# Patient Record
Sex: Male | Born: 1975 | Race: White | Hispanic: Yes | Marital: Single | Smoking: Never smoker
Health system: Southern US, Community
[De-identification: ages and names within clinical notes are randomized; demographics above are authoritative.]

---

## 2015-07-19 ENCOUNTER — Emergency Department (HOSPITAL_COMMUNITY): Payer: Self-pay

## 2015-07-19 ENCOUNTER — Encounter (HOSPITAL_COMMUNITY): Payer: Self-pay | Admitting: Emergency Medicine

## 2015-07-19 DIAGNOSIS — R1013 Epigastric pain: Secondary | ICD-10-CM | POA: Insufficient documentation

## 2015-07-19 DIAGNOSIS — Z79899 Other long term (current) drug therapy: Secondary | ICD-10-CM | POA: Insufficient documentation

## 2015-07-19 DIAGNOSIS — I10 Essential (primary) hypertension: Secondary | ICD-10-CM | POA: Insufficient documentation

## 2015-07-19 DIAGNOSIS — M549 Dorsalgia, unspecified: Secondary | ICD-10-CM | POA: Insufficient documentation

## 2015-07-19 LAB — CBC
HCT: 46.3 % (ref 39.0–52.0)
HEMOGLOBIN: 15.4 g/dL (ref 13.0–17.0)
MCH: 27.9 pg (ref 26.0–34.0)
MCHC: 33.3 g/dL (ref 30.0–36.0)
MCV: 83.9 fL (ref 78.0–100.0)
PLATELETS: 223 10*3/uL (ref 150–400)
RBC: 5.52 MIL/uL (ref 4.22–5.81)
RDW: 13.2 % (ref 11.5–15.5)
WBC: 14.6 10*3/uL — ABNORMAL HIGH (ref 4.0–10.5)

## 2015-07-19 LAB — BASIC METABOLIC PANEL
ANION GAP: 8 (ref 5–15)
BUN: 17 mg/dL (ref 6–20)
CALCIUM: 9.2 mg/dL (ref 8.9–10.3)
CO2: 28 mmol/L (ref 22–32)
CREATININE: 0.8 mg/dL (ref 0.61–1.24)
Chloride: 100 mmol/L — ABNORMAL LOW (ref 101–111)
GLUCOSE: 120 mg/dL — AB (ref 65–99)
Potassium: 4 mmol/L (ref 3.5–5.1)
Sodium: 136 mmol/L (ref 135–145)

## 2015-07-19 LAB — I-STAT TROPONIN, ED: TROPONIN I, POC: 0 ng/mL (ref 0.00–0.08)

## 2015-07-19 NOTE — ED Notes (Signed)
Patient arrives with complaint of chest pain secondary to eating. States onset about 1 hour ago. States history of similar and was seen by PCP after previous incidence but he was not diagnosed. Also endorses dizziness, lightheadedness, nausea, and left arm pain.

## 2015-07-20 ENCOUNTER — Emergency Department (HOSPITAL_COMMUNITY)
Admission: EM | Admit: 2015-07-20 | Discharge: 2015-07-20 | Disposition: A | Discharge status: Home / Self Care | Payer: Self-pay | Attending: Emergency Medicine | Admitting: Emergency Medicine

## 2015-07-20 DIAGNOSIS — R1013 Epigastric pain: Secondary | ICD-10-CM

## 2015-07-20 LAB — HEPATIC FUNCTION PANEL
ALBUMIN: 4.3 g/dL (ref 3.5–5.0)
ALK PHOS: 87 U/L (ref 38–126)
ALT: 30 U/L (ref 17–63)
AST: 28 U/L (ref 15–41)
BILIRUBIN TOTAL: 0.8 mg/dL (ref 0.3–1.2)
Total Protein: 7.5 g/dL (ref 6.5–8.1)

## 2015-07-20 LAB — LIPASE, BLOOD: Lipase: 32 U/L (ref 11–51)

## 2015-07-20 MED ORDER — SUCRALFATE 1 G PO TABS: 1.0000 g | ORAL_TABLET | Freq: Three times a day (TID) | ORAL | Status: DC

## 2015-07-20 MED ORDER — OMEPRAZOLE 20 MG PO CPDR: 20.0000 mg | DELAYED_RELEASE_CAPSULE | Freq: Two times a day (BID) | ORAL | Status: DC

## 2015-07-20 NOTE — ED Notes (Signed)
Patient reports that he has episodes of epigastric pain that radiates to his back - occurring after eating x 1 month. Reports he was prescribed Omeprazole, but states it was not effective.

## 2015-07-20 NOTE — Discharge Instructions (Signed)
YOU WILL NEED TO FOLLOW UP WITH A SPECIALIST FOR FURTHER EVALUATION OF RECURRENT PAIN WITH EATING. CALL [facility] FOR AN APPOINTMENT. TAKE MEDICATIONS AS PRESCRIBED. YOU ARE BEING PUT BACK ON PRILOSEC BUT AT AN INCREASED DOSE. RETURN TO THE EMERGENCY DEPARTMENT IF YOU HAVE ANY SEVERE PAIN, HIGH FEVER, OR NEW CONCERN.   Dispepsia (Indigestion) La dispepsia es una sensacin de dolor, Dentistmalestar, ardor o saciedad en la parte superior del abdomen. Puede aparecer y Geneticist, moleculardesaparecer, y ocurrir con frecuencia o en contadas ocasiones. La dispepsia suele producirse mientras una persona est comiendo o inmediatamente despus de haber terminado de comer. Puede ser ms intensa durante la noche y al inclinarse o al Arsenio Loaderacostarse. INSTRUCCIONES PARA EL CUIDADO EN EL HOGAR Tome estas medidas para Engineer, materialsaliviar el dolor o Environmental health practitionerel malestar y para Automotive engineerevitar las complicaciones. Dieta  Siga la dieta que le haya recomendado el mdico, la cual puede incluir evitar alimentos y bebidas tales como:  Caf y t (con o sin cafena).  Bebidas que contengan alcohol.  Bebidas energizantes y deportivas.  Gaseosas o refrescos.  Chocolate y cacao.  Menta y esencias de [address].  Ajo y cebollas.  Rbano picante.  Alimentos muy condimentados y cidos, entre ellos, pimientos, [place] en polvo, curry en polvo, vinagre, salsas picantes y [address] barbacoa.  Frutas ctricas y sus jugos, como naranjas, limones y limas.  Alimentos a base de tomates, como salsa roja, [place], salsa y pizza con salsa roja.  Alimentos fritos y Lexicographergrasos, como rosquillas, papas fritas y aderezos con alto contenido de Holiday representativegrasa.  Carnes con alto contenido de Big Copp[place] Keygrasa, como hot dogs y cortes grasos de carnes rojas y blancas, por ejemplo, filetes de entrecot, salchicha, jamn y tocino.  Productos lcteos con alto contenido de Brasheargrasa, como Mountvilleleche entera, Bon Airmantequilla y queso crema.  Haga comidas pequeas y frecuentes Freight forwarderdurante el da en lugar de comidas abundantes.  Evite  beber mucho lquido con las comidas.  No coma durante las 2 o 3horas previas a la hora de Benndaleacostarse.  No se acueste inmediatamente despus de comer.  No haga actividad fsica enseguida despus de comer. Instrucciones generales  Est atento a cualquier cambio en los sntomas.  Tome los medicamentos de venta libre y los recetados solamente como se lo haya indicado el mdico. No tome aspirina, ibuprofeno ni otros antiinflamatorios no esteroides (AINE), a menos que se lo haya indicado el mdico.  No consuma ningn producto que contenga tabaco, lo que incluye cigarrillos, tabaco de Theatre managermascar y Administrator, Civil Servicecigarrillos electrnicos. Si necesita ayuda para dejar de fumar, consulte al mdico.  Use ropas sueltas. No use prendas ajustadas alrededor de la cintura que ejerzan presin en el abdomen.  Levante (eleve) unas 6pulgadas (15centmetros) la cabecera de la cama.  Trate de reducir J. C. Penneyel nivel de estrs con actividades como el yoga o la meditacin. Si necesita ayuda para reducir J. C. Penneyel nivel de estrs, consulte al mdico.  Si tiene sobrepeso, Media planneradelgace hasta alcanzar un peso saludable. Hable con el mdico acerca de su peso ideal y pdale asesoramiento en cuanto a la dieta que debe seguir para Aeronautical engineerpoder alcanzarlo.  Concurra a todas las visitas de control como se lo haya indicado el mdico. Esto es importante. SOLICITE ATENCIN MDICA SI:  Aparecen nuevos sntomas.  Baja de peso sin causa aparente.  Tiene dificultad para tragar o siente dolor al Darden Restaurantshacerlo.  Los sntomas no mejoran con Scientist, research (medical)el tratamiento.  Los sntomas duran ms de [address] das.  Tiene fiebre.  Vomita. SOLICITE ATENCIN MDICA DE INMEDIATO SI:  Tiene dolor en  los brazos, el cuello, los Howard, la dentadura o la espalda.  Berenice Primas, se marea o tiene sensacin de desvanecimiento.  Se desmaya.  Siente falta de aire o Journalist, newspaper.  No puede dejar de vomitar o vomita sangre.  Las heces son sanguinolentas o de color negro.  Siente un  dolor intenso en el abdomen.   Esta informacin no tiene Theme park manager el consejo del mdico. Asegrese de hacerle al mdico cualquier pregunta que tenga.   Document Released: 02/13/2005 Document Revised: 11/04/2014 Elsevier Interactive Patient Education [employer]

## 2015-07-20 NOTE — ED Provider Notes (Signed)
CSN: [id number]     Arrival date & time 07/19/15  2209 History   First MD Initiated Contact with Patient 07/20/15 [phone]     Chief Complaint  Patient presents with  . Chest Pain     (Consider location/radiation/quality/duration/timing/severity/associated sxs/prior Treatment) HPI Comments: Patient with history of HTN presents with complaint of sharp pain in the epigastrium and chest that started around 15 minutes to one hour after eating. He can eat any type of food with the same result. The pain subsides with belching. No SOB, cough, fever or vomiting. No change in bowel movements. He had been given prilosec QD for symptoms but stopped taking it several days ago because it did not help with his symptoms.   Patient is a 40 y.o. male presenting with chest pain. The history is provided by the patient. No language interpreter was used.  Chest Pain Associated symptoms: abdominal pain and back pain (Pain radiates to back.)   Associated symptoms: no fever, no nausea, no shortness of breath and not vomiting     History reviewed. No pertinent past medical history. History reviewed. No pertinent past surgical history. History reviewed. No pertinent family history. Social History  Substance Use Topics  . Smoking status: Never Smoker   . Smokeless tobacco: None  . Alcohol Use: No    Review of Systems  Constitutional: Negative for fever and chills.  Respiratory: Negative.  Negative for shortness of breath.   Cardiovascular: Positive for chest pain.  Gastrointestinal: Positive for abdominal pain. Negative for nausea and vomiting.  Genitourinary: Negative.   Musculoskeletal: Positive for back pain (Pain radiates to back.).  Skin: Negative.   Neurological: Negative.       Allergies  Aspirin  Home Medications   Prior to Admission medications   Medication Sig Start Date End Date Taking? Authorizing Provider  lisinopril (PRINIVIL,ZESTRIL) 20 MG tablet Take 20 mg by mouth daily.   Yes  Historical Provider, MD  omeprazole (PRILOSEC) 20 MG capsule Take 20 mg by mouth daily.   Yes Historical Provider, MD   BP 135/87 mmHg  Pulse 66  Temp(Src) 98 F (36.7 C) (Oral)  Resp 21  SpO2 97% Physical Exam  Constitutional: He is oriented to person, place, and time. He appears well-developed and well-nourished.  HENT:  Head: Normocephalic.  Neck: Normal range of motion. Neck supple.  Cardiovascular: Normal rate and regular rhythm.   Pulmonary/Chest: Effort normal and breath sounds normal. He has no wheezes. He has no rales.  Abdominal: Soft. Bowel sounds are normal. There is no tenderness. There is no rebound and no guarding.  Musculoskeletal: Normal range of motion.  Neurological: He is alert and oriented to person, place, and time.  Skin: Skin is warm and dry. No rash noted.  Psychiatric: He has a normal mood and affect.    ED Course  Procedures (including critical care time) Labs Review Labs Reviewed  BASIC METABOLIC PANEL - Abnormal; Notable for the following:    Chloride 100 (*)    Glucose, Bld 120 (*)    All other components within normal limits  CBC - Abnormal; Notable for the following:    WBC 14.6 (*)    All other components within normal limits  HEPATIC FUNCTION PANEL - Abnormal; Notable for the following:    Bilirubin, Direct <0.1 (*)    All other components within normal limits  LIPASE, BLOOD  I-STAT TROPOININ, ED    Imaging Review Dg Chest 2 View  07/19/2015  CLINICAL DATA:  Chronic  generalized chest and abdominal pain and nausea. Initial encounter. EXAM: CHEST  2 VIEW COMPARISON:  None. FINDINGS: The lungs are well-aerated. Mild peribronchial thickening is noted. There is no evidence of focal opacification, pleural effusion or pneumothorax. The heart is normal in size; the mediastinal contour is within normal limits. No acute osseous abnormalities are seen. IMPRESSION: Mild peribronchial thickening noted.  Lungs otherwise grossly clear. Electronically  Signed   By: Roanna RaiderJeffery  Chang M.D.   On: 07/19/2015 23:02   I have personally reviewed and evaluated these images and lab results as part of my medical decision-making.   EKG Interpretation None      MDM   Final diagnoses:  None    1. Dyspepsia  The patient presents with post-prandial pain, improved with belching. DDx: gall stones vs indigestion vs ulcers. He has negative cardiac work up (non-ischemic EKG, negative troponin) doubt CAD. Normal LFT's, lipase - doubt acute cholecystitis, cannot rule out gall stones but as he is pain free now and non-tender, US not felt urgent. He is afebrile. He is felt stable for discharge home. Will refer to GI for further evaluation and management.     Elpidio AnisShari Ger Nicks, PA-C 07/22/15 0840  Derwood KaplanAnkit Nanavati, MD 07/22/15 2303

## 2016-05-11 ENCOUNTER — Encounter: Payer: Self-pay | Admitting: Oncology

## 2016-05-17 ENCOUNTER — Other Ambulatory Visit (HOSPITAL_COMMUNITY)
Admission: RE | Admit: 2016-05-17 | Discharge: 2016-05-17 | Disposition: A | Discharge status: Home / Self Care | Payer: Self-pay | Source: Ambulatory Visit | Attending: Oncology | Admitting: Oncology

## 2016-05-17 ENCOUNTER — Ambulatory Visit (HOSPITAL_BASED_OUTPATIENT_CLINIC_OR_DEPARTMENT_OTHER): Payer: Self-pay

## 2016-05-17 ENCOUNTER — Ambulatory Visit (HOSPITAL_BASED_OUTPATIENT_CLINIC_OR_DEPARTMENT_OTHER): Payer: Self-pay | Admitting: Oncology

## 2016-05-17 ENCOUNTER — Telehealth: Payer: Self-pay | Admitting: Oncology

## 2016-05-17 VITALS — BP 133/79 | HR 78 | Temp 97.9°F | Resp 18 | Ht 68.0 in | Wt 236.6 lb

## 2016-05-17 DIAGNOSIS — D72829 Elevated white blood cell count, unspecified: Secondary | ICD-10-CM

## 2016-05-17 DIAGNOSIS — E119 Type 2 diabetes mellitus without complications: Secondary | ICD-10-CM

## 2016-05-17 DIAGNOSIS — I10 Essential (primary) hypertension: Secondary | ICD-10-CM

## 2016-05-17 LAB — CBC WITH DIFFERENTIAL/PLATELET
BASO%: 0.5 % (ref 0.0–2.0)
Basophils Absolute: 0 10*3/uL (ref 0.0–0.1)
EOS%: 1.1 % (ref 0.0–7.0)
Eosinophils Absolute: 0.1 10*3/uL (ref 0.0–0.5)
HEMATOCRIT: 46.1 % (ref 38.4–49.9)
HEMOGLOBIN: 15.6 g/dL (ref 13.0–17.1)
LYMPH#: 2.5 10*3/uL (ref 0.9–3.3)
LYMPH%: 23.7 % (ref 14.0–49.0)
MCH: 28.6 pg (ref 27.2–33.4)
MCHC: 33.9 g/dL (ref 32.0–36.0)
MCV: 84.4 fL (ref 79.3–98.0)
MONO#: 0.8 10*3/uL (ref 0.1–0.9)
MONO%: 7.9 % (ref 0.0–14.0)
NEUT%: 66.8 % (ref 39.0–75.0)
NEUTROS ABS: 7 10*3/uL — AB (ref 1.5–6.5)
PLATELETS: 214 10*3/uL (ref 140–400)
RBC: 5.46 10*6/uL (ref 4.20–5.82)
RDW: 13.8 % (ref 11.0–14.6)
WBC: 10.5 10*3/uL — AB (ref 4.0–10.3)

## 2016-05-17 LAB — MORPHOLOGY
PLT EST: ADEQUATE
RBC Comments: NORMAL

## 2016-05-17 NOTE — Progress Notes (Signed)
[facility] Cancer Initial Visit:  Patient Care Team: Sandre Kitty, PA-C as PCP - General (Physician Assistant)  CHIEF COMPLAINTS/PURPOSE OF CONSULTATION:  Patient was advised to have  hematology evaluation regarding further workup related to chronic leukocytosis.    HISTORY OF PRESENTING ILLNESS: James Frazier 41 y.o. male is here because of  persistent leukocytosis.  Patient has known history of hypertension diabetes and diabetes mellitus. Patient was also diagnosed with H. pylori infection in 2017. Patient was treated with the broad-spectrum antibiotics. According to the patient, his H. pylori antigen became negative after the end of treatment with antibiotics.  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  In May of 2017 his routine CBC has revealed elevated WBC count around 13,000. However recently on 04/04/16,  his repeat CBC has revealed WBC count of 12.6, hemoglobin 15 g and platelet  count of 241,000. Hence patient was advised to have a hematology evaluation.  At this time patient denies any new symptoms. Patient denies fever, chills or night sweats. Patient denies cough or hemoptysis or  frequent urinary tract infections. Patient denies nausea, vomiting or abdominal pain. Patient denies  bright red r blood per rectum, melena or hematochezia. Patient denies spontaneous bruises or ecchymosis. Patient denies having any lymph gland enlargement.  Patient has no family history of hematological disorders. He was born in [place] has been in the [place] for several years. 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  Patient is accompanied by an interpreter today.     Review of Systems  Constitutional: Negative for appetite change, chills, diaphoresis, fatigue, fever and unexpected weight change.  HENT:   Negative for hearing loss, lump/mass, mouth sores, nosebleeds, sore throat, tinnitus, trouble swallowing  and voice change.   Eyes: Negative for icterus.  Respiratory: Negative for chest tightness, cough, shortness of breath and wheezing.   Cardiovascular: Negative for chest pain, leg swelling and palpitations.  Gastrointestinal: Negative for abdominal distention, abdominal pain, blood in stool, constipation, diarrhea, nausea, rectal pain and vomiting.  Genitourinary: Negative for bladder incontinence, difficulty urinating, dyspareunia, dysuria, frequency, hematuria and nocturia.   Musculoskeletal: Negative for arthralgias, back pain, flank pain, myalgias, neck pain and neck stiffness.  Skin: Negative for itching and rash.  Neurological: Negative for dizziness, headaches, light-headedness, numbness, seizures and speech difficulty.  Hematological: Negative for adenopathy. Does not bruise/bleed easily.  Psychiatric/Behavioral: Negative for confusion, decreased concentration, depression and sleep disturbance. The patient is not nervous/anxious.     MEDICAL HISTORY: History reviewed. No pertinent past medical history.  SURGICAL HISTORY: History reviewed. No pertinent surgical history.  SOCIAL HISTORY: Social History   Social History  . Marital status: Single    Spouse name: N/A  . Number of children: N/A  . Years of education: N/A   Occupational History  . Not on file.   Social History Main Topics  . Smoking status: Never Smoker  . Smokeless tobacco: Not on file  . Alcohol use No  . Drug use: No  . Sexual activity: Not on file   Other Topics Concern  . Not on file   Social History Narrative  . No narrative on file    FAMILY HISTORY History reviewed. No pertinent family history.  ALLERGIES:  is allergic to aspirin.  MEDICATIONS:  Current Outpatient Prescriptions  Medication Sig Dispense Refill  . metFORMIN (GLUCOPHAGE) 500 MG tablet Take 500 mg by mouth 2 (two) times  daily with a meal.    . terbinafine (LAMISIL) 250 MG tablet Take 250 mg by mouth daily.    Marland Kitchen. lisinopril  (PRINIVIL,ZESTRIL) 20 MG tablet Take 20 mg by mouth daily.     No current facility-administered medications for this visit.     PHYSICAL EXAMINATION:  ECOG PERFORMANCE STATUS: 0 - Asymptomatic   Vitals:   05/17/16 0933  BP: 133/79  Pulse: 78  Resp: 18  Temp: 97.9 F (36.6 C)    Filed Weights   05/17/16 0933  Weight: 236 lb 9 oz (107.3 kg)     Physical Exam  Constitutional: He is oriented to person, place, and time and well-developed, well-nourished, and in no distress. No distress.  HENT:  Head: Normocephalic and atraumatic.  Right Ear: External ear normal.  Left Ear: External ear normal.  Nose: Nose normal.  Mouth/Throat: Oropharynx is clear and moist.  Eyes: Conjunctivae and EOM are normal. Pupils are equal, round, and reactive to light. Right eye exhibits no discharge. Left eye exhibits no discharge.  Neck: Normal range of motion. Neck supple. No JVD present. No tracheal deviation present. No thyromegaly present.  Cardiovascular: Normal rate, regular rhythm and intact distal pulses.  Exam reveals no gallop and no friction rub.   No murmur heard. Pulmonary/Chest: Effort normal and breath sounds normal. No stridor. No respiratory distress. He has no wheezes. He has no rales. He exhibits no tenderness.  Abdominal: He exhibits no distension and no mass. There is no tenderness. There is no rebound and no guarding.  Musculoskeletal: Normal range of motion. He exhibits no edema, tenderness or deformity.  Lymphadenopathy:    He has no cervical adenopathy.  Neurological: He is alert and oriented to person, place, and time. No cranial nerve deficit. Gait normal. Coordination normal.  Skin: Skin is warm and dry. He is not diaphoretic. No erythema. No pallor.  Psychiatric: Mood, memory, affect and judgment normal.     LABORATORY DATA: I have personally reviewed the data as listed:  Appointment on 05/17/2016  Component Date Value Ref Range Status  . WBC 05/17/2016 10.5* 4.0  - 10.3 10e3/uL Final  . NEUT# 05/17/2016 7.0* 1.5 - 6.5 10e3/uL Final  . HGB 05/17/2016 15.6  13.0 - 17.1 g/dL Final  . HCT 40/98/119103/21/2018 46.1  38.4 - 49.9 % Final  . Platelets 05/17/2016 214  140 - 400 10e3/uL Final  . MCV 05/17/2016 84.4  79.3 - 98.0 fL Final  . MCH 05/17/2016 28.6  27.2 - 33.4 pg Final  . MCHC 05/17/2016 33.9  32.0 - 36.0 g/dL Final  . RBC 47/82/956203/21/2018 5.46  4.20 - 5.82 10e6/uL Final  . RDW 05/17/2016 13.8  11.0 - 14.6 % Final  . lymph# 05/17/2016 2.5  0.9 - 3.3 10e3/uL Final  . MONO# 05/17/2016 0.8  0.1 - 0.9 10e3/uL Final  . Eosinophils Absolute 05/17/2016 0.1  0.0 - 0.5 10e3/uL Final  . Basophils Absolute 05/17/2016 0.0  0.0 - 0.1 10e3/uL Final  . NEUT% 05/17/2016 66.8  39.0 - 75.0 % Final  . LYMPH% 05/17/2016 23.7  14.0 - 49.0 % Final  . MONO% 05/17/2016 7.9  0.0 - 14.0 % Final  . EOS% 05/17/2016 1.1  0.0 - 7.0 % Final  . BASO% 05/17/2016 0.5  0.0 - 2.0 % Final   No results found.  ASSESSMENT/PLAN  1. Persistent leukocytosis. 2. History of hypertension under control. 3. Diabetes mellitus.  Mr. James Frazier a 41 y.o.Hispanic gentleman has been diagnosed with the mild leukocytosis.  He  has no evidence of any obvious infection. At this time the etiology of leukocytosis is not known. My physical examination is unremarkable without any evidence of hepatosplenomegaly. However I would recommend sending further tests including repeat CBC with  peripheral smear evaluation, flow cytometry and molecular studies to rule out myeloproliferative disorder.  The above tests were sent today in the outpatient clinic. A follow-up appointment has been scheduled in the outpatient clinic in 10 days to discuss the about test results.  I would like to thank again his primary care provider for giving me the opportunity to participate in his care.    Orders Placed This Encounter  Procedures  . CBC with Differential    Standing Status:   Future    Number of Occurrences:   1     Standing Expiration Date:   05/17/2017  . Morphology    Standing Status:   Future    Number of Occurrences:   1    Standing Expiration Date:   05/17/2017  . Flow Cytometry    Leukemia / lymphoma panel    Standing Status:   Future    Number of Occurrences:   1    Standing Expiration Date:   05/17/2017  . BCR-ABL1 FISH    Standing Status:   Future    Number of Occurrences:   1    Standing Expiration Date:   05/17/2017    All questions were answered. The patient knows to call the clinic with any problems, questions or concerns.  This note was electronically signed.    Rudean Haskell, MD  05/17/2016 11:06 AM

## 2016-05-17 NOTE — Telephone Encounter (Signed)
Gave patient avs report and appointments for April  °

## 2016-05-19 LAB — FLOW CYTOMETRY

## 2016-05-31 ENCOUNTER — Ambulatory Visit: Payer: Self-pay

## 2016-06-08 ENCOUNTER — Telehealth: Payer: Self-pay | Admitting: *Deleted

## 2016-06-08 ENCOUNTER — Telehealth: Payer: Self-pay | Admitting: Oncology

## 2016-06-08 NOTE — Telephone Encounter (Signed)
Patient called.  Asked for someone who speaks Spanish to help.  [employer] interpreter operator Michell Heinrich [phone] very helpful.    "Lab work was done two weeks ago.  I need the results."  Shared cbc results with improved total white count.  Provider will go over the rest.  Were you aware of appointment 05-31-2016 at 9:30 am?  "I was told I do not need to come in for an appointment but results would be sent to my PCP.  My PCP nor I have received the lab results yet.  Please ask the doctor.  I need results today."  Called provider.  Patient was instructed to make a F/U appointment when here for the new patient visit.  Verbal order received and read back for patient to reschedule F/U for patient to review results with provider.  Tried to reach scheduler during this call unsuccessfully.  Notified patient he'll need to reschedule appointment to review results with provider.  "It is expensive for me to go there.  If I reschedule will this be a new bill or is it part of the first bill?  Is this necessary?"  Per provider orders and instructions, yes it is necessary to reschedule.  Offered Denied to transfer to scheduler at this time with interpreter help.  "I'll have to think about this."  Confirmed return number, [phone].  Expect a call from a scheduler to reschedule.  Thanks for calling and have a good day.  Unk Pinto and patient say Adios.  Thanked her, she thanked me for using [employer].   Scheduling message sent.

## 2016-06-08 NOTE — Telephone Encounter (Signed)
sw pt using language line interpreter [phone]. Confirmed 4/17 appt at 1030 am per los

## 2016-06-13 ENCOUNTER — Telehealth: Payer: Self-pay | Admitting: Oncology

## 2016-06-13 ENCOUNTER — Ambulatory Visit (HOSPITAL_BASED_OUTPATIENT_CLINIC_OR_DEPARTMENT_OTHER): Payer: Self-pay | Admitting: Oncology

## 2016-06-13 VITALS — BP 137/87 | HR 69 | Temp 98.7°F | Resp 18 | Ht 68.0 in | Wt 236.5 lb

## 2016-06-13 DIAGNOSIS — D72829 Elevated white blood cell count, unspecified: Secondary | ICD-10-CM

## 2016-06-13 NOTE — Progress Notes (Signed)
[facility] Cancer Follow up:    James Kitty, PA-C [address] [place] [address]  INTERVAL HISTORY: James Frazier 41 y.o. male returns for scheduled follow-up visit.  At this time patient denies new complaints I had the opportunity to evaluate him initially for leukocytosis. I recommended sending peripheral blood molecular studies for BCR able translocation. However the above tests came back negative.  Today patient has no new complaints. His repeat CBC has showed his WBC count count has came down. Today I did not perform complete physical examination. However I have reviewed the laboratory results with patient with the help of an interpreter.   There are no active problems to display for this patient.   is allergic to aspirin.  MEDICAL HISTORY: No past medical history on file.  SURGICAL HISTORY: No past surgical history on file.  SOCIAL HISTORY: Social History   Social History  . Marital status: Single    Spouse name: N/A  . Number of children: N/A  . Years of education: N/A   Occupational History  . Not on file.   Social History Main Topics  . Smoking status: Never Smoker  . Smokeless tobacco: Not on file  . Alcohol use No  . Drug use: No  . Sexual activity: Not on file   Other Topics Concern  . Not on file   Social History Narrative  . No narrative on file    FAMILY HISTORY: No family history on file.  Review of Systems - Oncology    PHYSICAL EXAMINATION  ECOG PERFORMANCE STATUS: 0 - Asymptomatic  Vitals:   06/13/16 1116  BP: 137/87  Pulse: 69  Resp: 18  Temp: 98.7 F (37.1 C)    Physical Exam  LABORATORY DATA:  CBC    Component Value Date/Time   WBC 10.5 (H) 05/17/2016 1032   WBC 14.6 (H) 07/19/2015 2228   RBC 5.46 05/17/2016 1032   RBC 5.52 07/19/2015 2228   HGB 15.6 05/17/2016 1032   HCT 46.1 05/17/2016 1032   PLT 214 05/17/2016 1032   MCV 84.4 05/17/2016 1032   MCH 28.6 05/17/2016 1032    MCH 27.9 07/19/2015 2228   MCHC 33.9 05/17/2016 1032   MCHC 33.3 07/19/2015 2228   RDW 13.8 05/17/2016 1032   LYMPHSABS 2.5 05/17/2016 1032   MONOABS 0.8 05/17/2016 1032   EOSABS 0.1 05/17/2016 1032   BASOSABS 0.0 05/17/2016 1032    CMP     Component Value Date/Time   NA 136 07/19/2015 2228   K 4.0 07/19/2015 2228   CL 100 (L) 07/19/2015 2228   CO2 28 07/19/2015 2228   GLUCOSE 120 (H) 07/19/2015 2228   BUN 17 07/19/2015 2228   CREATININE 0.80 07/19/2015 2228   CALCIUM 9.2 07/19/2015 2228   PROT 7.5 07/19/2015 2228   ALBUMIN 4.3 07/19/2015 2228   AST 28 07/19/2015 2228   ALT 30 07/19/2015 2228   ALKPHOS 87 07/19/2015 2228   BILITOT 0.8 07/19/2015 2228   GFRNONAA >60 07/19/2015 2228   GFRAA >60 07/19/2015 2228       PENDING LABS:   RADIOGRAPHIC STUDIES:  No results found.   PATHOLOGY:     ASSESSMENT and THERAPY PLAN:    1. Persistent leukocytosis. 2. History of hypertension under control. 3. Diabetes mellitus.   Patient at this time is asymptomatic. The etiology of her mild leukocytosis is not known. However the molecular studies for myeloproliferative disorders have been negative. His hemoglobin and platelets are  within normal range. Hence I did not make any further recommendations at this time.  I have advised him to be followed by his primary care provider. I have advised him to have a follow-up visit in hematology clinic as needed basis. At this time I have discharged him from the hematology clinic. Patient expressed the understanding and wishes to be followed by his primary care provider.   No orders of the defined types were placed in this encounter.   All questions were answered. The patient knows to call the clinic with any problems, questions or concerns. We can certainly see the patient much sooner if necessary. This note was electronically signed. Rudean Haskell, MD 06/13/2016

## 2016-06-13 NOTE — Telephone Encounter (Signed)
Per Dr. Windell Moment - no los

## 2016-12-03 IMAGING — DX DG CHEST 2V
2 series · 2 of 2 positions shown · non-contrast
Comparison: None.

CLINICAL DATA: Chronic generalized chest and abdominal pain and
nausea. Initial encounter.

EXAM:
CHEST  2 VIEW

[chest pa]
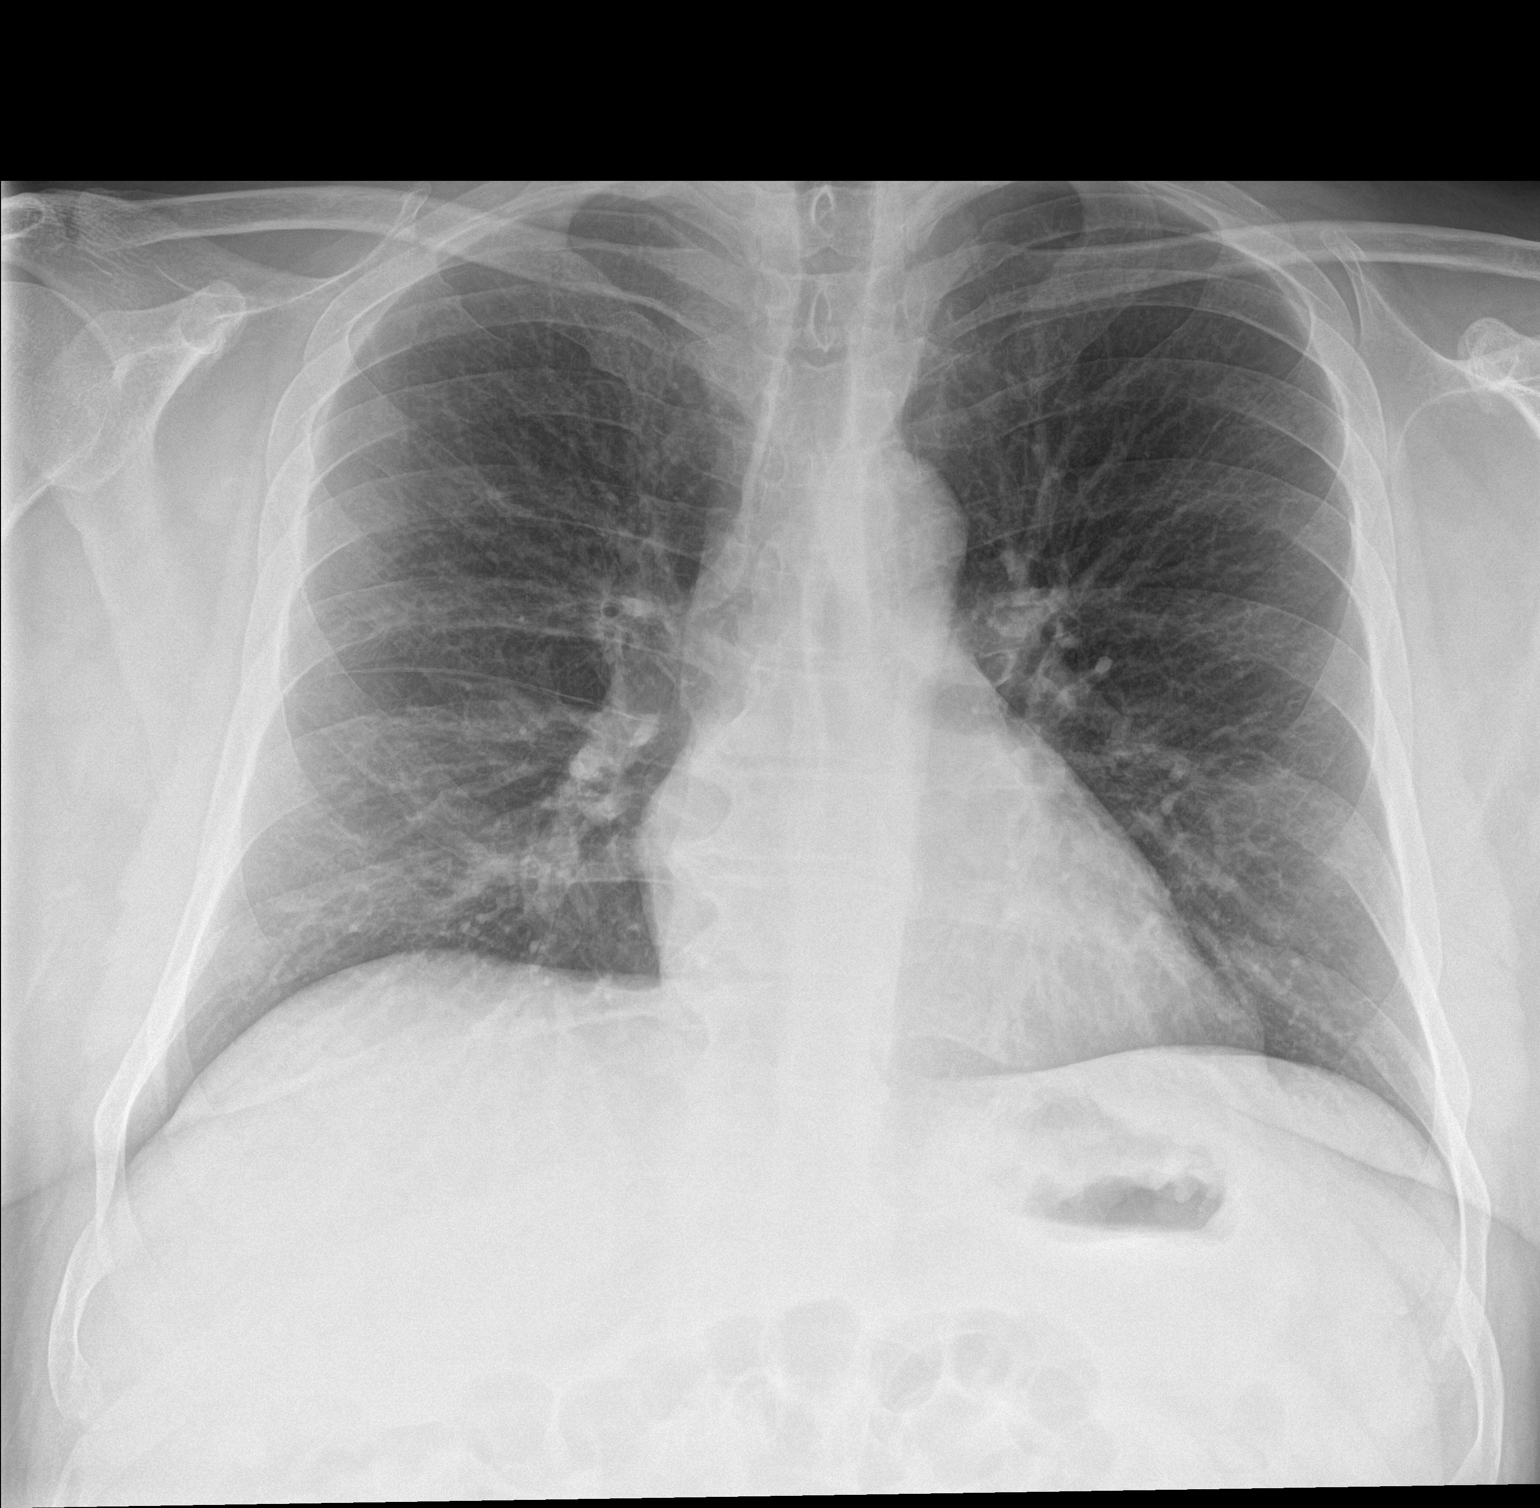

[chest lat]
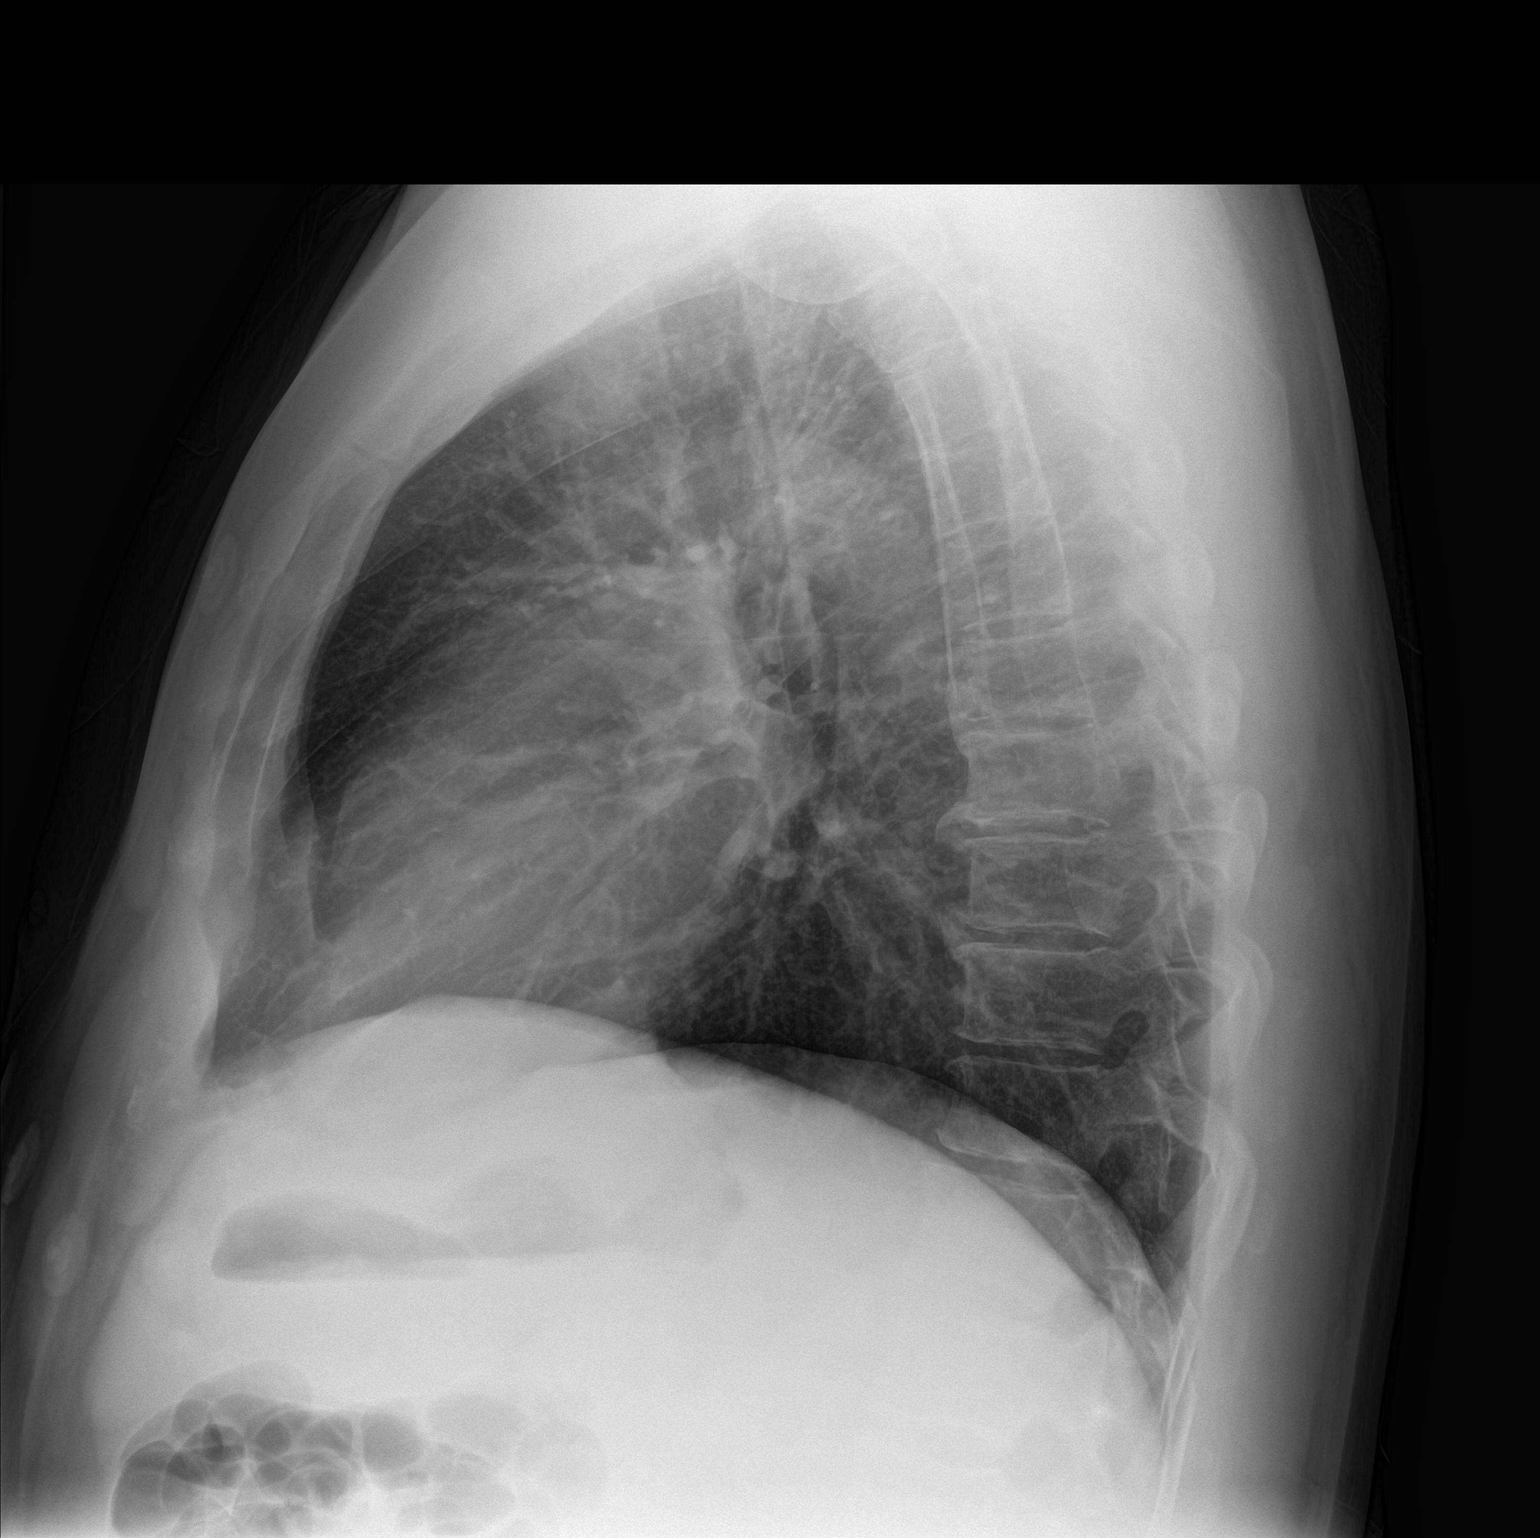

[2 of 2 positions shown; findings below may reference images not displayed]

FINDINGS: The lungs are well-aerated. Mild peribronchial thickening is noted.
There is no evidence of focal opacification, pleural effusion or
pneumothorax.

The heart is normal in size; the mediastinal contour is within
normal limits. No acute osseous abnormalities are seen.
IMPRESSION: Mild peribronchial thickening noted.  Lungs otherwise grossly clear.
